# Patient Record
Sex: Female | Born: 1975 | Race: Black or African American | Hispanic: No | Marital: Single | Smoking: Heavy tobacco smoker
Health system: Southern US, Community
[De-identification: ages and names within clinical notes are randomized; demographics above are authoritative.]

## PROBLEM LIST (undated history)

## (undated) DIAGNOSIS — F319 Bipolar disorder, unspecified: Secondary | ICD-10-CM

---

## 1999-03-26 ENCOUNTER — Emergency Department (HOSPITAL_COMMUNITY): Admission: EM | Admit: 1999-03-26 | Discharge: 1999-03-26 | Payer: Self-pay | Admitting: Emergency Medicine

## 1999-03-27 ENCOUNTER — Encounter: Payer: Self-pay | Admitting: Emergency Medicine

## 1999-05-17 ENCOUNTER — Ambulatory Visit (HOSPITAL_COMMUNITY): Admission: RE | Admit: 1999-05-17 | Discharge: 1999-05-17 | Payer: Self-pay | Admitting: *Deleted

## 1999-10-14 ENCOUNTER — Inpatient Hospital Stay (HOSPITAL_COMMUNITY): Admission: AD | Admit: 1999-10-14 | Discharge: 1999-10-16 | Payer: Self-pay

## 1999-10-14 ENCOUNTER — Encounter (INDEPENDENT_AMBULATORY_CARE_PROVIDER_SITE_OTHER): Payer: Self-pay | Admitting: Specialist

## 2000-05-26 ENCOUNTER — Emergency Department (HOSPITAL_COMMUNITY): Admission: EM | Admit: 2000-05-26 | Discharge: 2000-05-26 | Payer: Self-pay | Admitting: Emergency Medicine

## 2001-01-31 ENCOUNTER — Emergency Department (HOSPITAL_COMMUNITY): Admission: EM | Admit: 2001-01-31 | Discharge: 2001-01-31 | Payer: Self-pay | Admitting: Emergency Medicine

## 2001-03-20 ENCOUNTER — Emergency Department (HOSPITAL_COMMUNITY): Admission: EM | Admit: 2001-03-20 | Discharge: 2001-03-20 | Payer: Self-pay | Admitting: *Deleted

## 2002-08-27 ENCOUNTER — Emergency Department (HOSPITAL_COMMUNITY): Admission: EM | Admit: 2002-08-27 | Discharge: 2002-08-27 | Payer: Self-pay | Admitting: Emergency Medicine

## 2003-01-27 ENCOUNTER — Emergency Department (HOSPITAL_COMMUNITY): Admission: EM | Admit: 2003-01-27 | Discharge: 2003-01-27 | Payer: Self-pay | Admitting: Emergency Medicine

## 2003-05-30 ENCOUNTER — Encounter: Admission: RE | Admit: 2003-05-30 | Discharge: 2003-05-30 | Payer: Self-pay | Admitting: Internal Medicine

## 2003-05-30 ENCOUNTER — Encounter: Payer: Self-pay | Admitting: Internal Medicine

## 2003-12-10 ENCOUNTER — Emergency Department (HOSPITAL_COMMUNITY): Admission: EM | Admit: 2003-12-10 | Discharge: 2003-12-10 | Payer: Self-pay | Admitting: Emergency Medicine

## 2003-12-12 ENCOUNTER — Emergency Department (HOSPITAL_COMMUNITY): Admission: EM | Admit: 2003-12-12 | Discharge: 2003-12-12 | Payer: Self-pay | Admitting: Family Medicine

## 2003-12-20 ENCOUNTER — Emergency Department (HOSPITAL_COMMUNITY): Admission: EM | Admit: 2003-12-20 | Discharge: 2003-12-20 | Payer: Self-pay | Admitting: Emergency Medicine

## 2004-02-03 ENCOUNTER — Emergency Department (HOSPITAL_COMMUNITY): Admission: EM | Admit: 2004-02-03 | Discharge: 2004-02-03 | Payer: Self-pay | Admitting: Emergency Medicine

## 2004-02-10 ENCOUNTER — Emergency Department (HOSPITAL_COMMUNITY): Admission: EM | Admit: 2004-02-10 | Discharge: 2004-02-10 | Payer: Self-pay | Admitting: Emergency Medicine

## 2004-03-25 ENCOUNTER — Encounter: Admission: RE | Admit: 2004-03-25 | Discharge: 2004-03-25 | Payer: Self-pay | Admitting: Obstetrics and Gynecology

## 2005-06-13 ENCOUNTER — Emergency Department (HOSPITAL_COMMUNITY): Admission: EM | Admit: 2005-06-13 | Discharge: 2005-06-13 | Payer: Self-pay | Admitting: *Deleted

## 2005-07-07 ENCOUNTER — Encounter: Admission: RE | Admit: 2005-07-07 | Discharge: 2005-07-07 | Payer: Self-pay | Admitting: Internal Medicine

## 2005-07-23 ENCOUNTER — Emergency Department (HOSPITAL_COMMUNITY): Admission: EM | Admit: 2005-07-23 | Discharge: 2005-07-23 | Payer: Self-pay | Admitting: Emergency Medicine

## 2005-08-26 ENCOUNTER — Inpatient Hospital Stay (HOSPITAL_COMMUNITY): Admission: AD | Admit: 2005-08-26 | Discharge: 2005-09-09 | Payer: Self-pay | Admitting: Psychiatry

## 2005-08-27 ENCOUNTER — Ambulatory Visit: Payer: Self-pay | Admitting: Psychiatry

## 2005-09-24 ENCOUNTER — Emergency Department (HOSPITAL_COMMUNITY): Admission: EM | Admit: 2005-09-24 | Discharge: 2005-09-24 | Payer: Self-pay | Admitting: Emergency Medicine

## 2005-10-11 ENCOUNTER — Emergency Department (HOSPITAL_COMMUNITY): Admission: EM | Admit: 2005-10-11 | Discharge: 2005-10-11 | Payer: Self-pay | Admitting: Emergency Medicine

## 2005-12-17 ENCOUNTER — Emergency Department (HOSPITAL_COMMUNITY): Admission: EM | Admit: 2005-12-17 | Discharge: 2005-12-17 | Payer: Self-pay | Admitting: Emergency Medicine

## 2006-12-24 ENCOUNTER — Emergency Department (HOSPITAL_COMMUNITY): Admission: EM | Admit: 2006-12-24 | Discharge: 2006-12-25 | Payer: Self-pay | Admitting: Emergency Medicine

## 2007-09-11 ENCOUNTER — Emergency Department (HOSPITAL_COMMUNITY): Admission: EM | Admit: 2007-09-11 | Discharge: 2007-09-11 | Payer: Self-pay | Admitting: Emergency Medicine

## 2009-06-30 ENCOUNTER — Emergency Department (HOSPITAL_BASED_OUTPATIENT_CLINIC_OR_DEPARTMENT_OTHER): Admission: EM | Admit: 2009-06-30 | Discharge: 2009-06-30 | Payer: Self-pay | Admitting: Emergency Medicine

## 2009-10-04 ENCOUNTER — Other Ambulatory Visit: Payer: Self-pay | Admitting: Emergency Medicine

## 2009-10-06 ENCOUNTER — Ambulatory Visit: Payer: Self-pay | Admitting: Psychiatry

## 2009-10-06 ENCOUNTER — Inpatient Hospital Stay (HOSPITAL_COMMUNITY): Admission: AD | Admit: 2009-10-06 | Discharge: 2009-11-21 | Payer: Self-pay | Admitting: Psychiatry

## 2009-12-01 ENCOUNTER — Emergency Department (HOSPITAL_COMMUNITY): Admission: EM | Admit: 2009-12-01 | Discharge: 2009-12-03 | Payer: Self-pay | Admitting: Emergency Medicine

## 2010-03-30 ENCOUNTER — Emergency Department (HOSPITAL_BASED_OUTPATIENT_CLINIC_OR_DEPARTMENT_OTHER): Admission: EM | Admit: 2010-03-30 | Discharge: 2010-03-30 | Payer: Self-pay | Admitting: Emergency Medicine

## 2010-04-05 ENCOUNTER — Other Ambulatory Visit: Payer: Self-pay | Admitting: Emergency Medicine

## 2010-04-05 ENCOUNTER — Inpatient Hospital Stay (HOSPITAL_COMMUNITY): Admission: EM | Admit: 2010-04-05 | Discharge: 2010-04-09 | Payer: Self-pay | Admitting: Internal Medicine

## 2010-04-06 ENCOUNTER — Ambulatory Visit: Payer: Self-pay | Admitting: Psychiatry

## 2010-10-15 ENCOUNTER — Emergency Department (HOSPITAL_BASED_OUTPATIENT_CLINIC_OR_DEPARTMENT_OTHER)
Admission: EM | Admit: 2010-10-15 | Discharge: 2010-10-15 | Disposition: A | Discharge status: Other Acute Inpatient Hospital | Payer: Self-pay | Source: Home / Self Care | Admitting: Emergency Medicine

## 2011-01-18 LAB — POCT TOXICOLOGY PANEL

## 2011-01-18 LAB — URINE MICROSCOPIC-ADD ON

## 2011-01-18 LAB — URINALYSIS, ROUTINE W REFLEX MICROSCOPIC
Glucose, UA: NEGATIVE mg/dL
Leukocytes, UA: NEGATIVE
Protein, ur: 100 mg/dL — AB
Specific Gravity, Urine: 1.031 — ABNORMAL HIGH (ref 1.005–1.030)
Urobilinogen, UA: 0.2 mg/dL (ref 0.0–1.0)
pH: 6 (ref 5.0–8.0)

## 2011-01-18 LAB — DIFFERENTIAL
Basophils Absolute: 0 10*3/uL (ref 0.0–0.1)
Basophils Relative: 1 % (ref 0–1)
Eosinophils Absolute: 0.1 10*3/uL (ref 0.0–0.7)
Lymphocytes Relative: 22 % (ref 12–46)
Lymphs Abs: 1.3 10*3/uL (ref 0.7–4.0)
Neutro Abs: 4 10*3/uL (ref 1.7–7.7)

## 2011-01-18 LAB — LIPASE, BLOOD: Lipase: 43 U/L (ref 23–300)

## 2011-01-18 LAB — CBC
MCHC: 34.4 g/dL (ref 30.0–36.0)
RDW: 11.5 % (ref 11.5–15.5)

## 2011-01-23 LAB — DIFFERENTIAL
Basophils Relative: 2 % — ABNORMAL HIGH (ref 0–1)
Lymphs Abs: 3 10*3/uL (ref 0.7–4.0)
Monocytes Relative: 10 % (ref 3–12)
Neutro Abs: 2.4 10*3/uL (ref 1.7–7.7)

## 2011-01-23 LAB — RAPID URINE DRUG SCREEN, HOSP PERFORMED
Amphetamines: NOT DETECTED
Benzodiazepines: NOT DETECTED
Cocaine: NOT DETECTED
Opiates: NOT DETECTED

## 2011-01-23 LAB — BASIC METABOLIC PANEL
BUN: 4 mg/dL — ABNORMAL LOW (ref 6–23)
CO2: 28 mEq/L (ref 19–32)
Chloride: 102 mEq/L (ref 96–112)
GFR calc non Af Amer: >60 mL/min (ref >60)
Sodium: 139 mEq/L (ref 135–145)

## 2011-01-23 LAB — URINALYSIS, ROUTINE W REFLEX MICROSCOPIC

## 2011-01-23 LAB — URINE CULTURE: Colony Count: 70000

## 2011-01-23 LAB — ETHANOL: Alcohol, Ethyl (B): 6 mg/dL (ref 0–10)

## 2011-01-23 LAB — CBC
HCT: 33.6 % — ABNORMAL LOW (ref 36.0–46.0)
Hemoglobin: 11.2 g/dL — ABNORMAL LOW (ref 12.0–15.0)
MCV: 92.7 fL (ref 78.0–100.0)
Platelets: 318 10*3/uL (ref 150–400)
RDW: 15.3 % (ref 11.5–15.5)
WBC: 6.5 10*3/uL (ref 4.0–10.5)

## 2011-01-23 LAB — URINE MICROSCOPIC-ADD ON

## 2011-01-24 LAB — URINALYSIS, ROUTINE W REFLEX MICROSCOPIC
Glucose, UA: NEGATIVE mg/dL
Glucose, UA: NEGATIVE mg/dL
Ketones, ur: NEGATIVE mg/dL
Ketones, ur: 40 mg/dL — AB
Nitrite: NEGATIVE
Nitrite: POSITIVE — AB
Protein, ur: NEGATIVE mg/dL
Protein, ur: 30 mg/dL — AB
Specific Gravity, Urine: 1.037 — ABNORMAL HIGH (ref 1.005–1.030)
Urobilinogen, UA: 1 mg/dL (ref 0.0–1.0)
Urobilinogen, UA: 1 mg/dL (ref 0.0–1.0)
Urobilinogen, UA: 1 mg/dL (ref 0.0–1.0)

## 2011-01-24 LAB — URINE CULTURE: Colony Count: >=100000

## 2011-01-24 LAB — RETICULOCYTES
RBC.: 2.72 MIL/uL — ABNORMAL LOW (ref 3.87–5.11)
Retic Count, Absolute: 16.3 10*3/uL — ABNORMAL LOW (ref 19.0–186.0)
Retic Ct Pct: 0.6 % (ref 0.4–3.1)

## 2011-01-24 LAB — BASIC METABOLIC PANEL
BUN: <1 mg/dL — ABNORMAL LOW (ref 6–23)
BUN: 7 mg/dL (ref 6–23)
CO2: 29 mEq/L (ref 19–32)
CO2: 29 mEq/L (ref 19–32)
CO2: 28 mEq/L (ref 19–32)
Calcium: 8.5 mg/dL (ref 8.4–10.5)
Calcium: 9.9 mg/dL (ref 8.4–10.5)
Chloride: 98 mEq/L (ref 96–112)
Creatinine, Ser: 0.65 mg/dL (ref 0.4–1.2)
Creatinine, Ser: 0.84 mg/dL (ref 0.4–1.2)
Creatinine, Ser: 0.7 mg/dL (ref 0.4–1.2)
GFR calc Af Amer: >60 mL/min (ref >60)
GFR calc Af Amer: >60 mL/min (ref >60)
GFR calc non Af Amer: >60 mL/min (ref >60)
Glucose, Bld: 82 mg/dL (ref 70–99)
Sodium: 137 mEq/L (ref 135–145)

## 2011-01-24 LAB — COMPREHENSIVE METABOLIC PANEL
ALT: 10 U/L (ref 0–35)
AST: 22 U/L (ref 0–37)
AST: 29 U/L (ref 0–37)
Albumin: 2.5 g/dL — ABNORMAL LOW (ref 3.5–5.2)
Alkaline Phosphatase: 26 U/L — ABNORMAL LOW (ref 39–117)
CO2: 28 mEq/L (ref 19–32)
Calcium: 6.8 mg/dL — ABNORMAL LOW (ref 8.4–10.5)
Calcium: 9.4 mg/dL (ref 8.4–10.5)
Creatinine, Ser: 1 mg/dL (ref 0.4–1.2)
GFR calc Af Amer: >60 mL/min (ref >60)
GFR calc Af Amer: >60 mL/min (ref >60)
GFR calc non Af Amer: >60 mL/min (ref >60)
Glucose, Bld: 99 mg/dL (ref 70–99)
Glucose, Bld: 124 mg/dL — ABNORMAL HIGH (ref 70–99)
Potassium: 3.4 mEq/L — ABNORMAL LOW (ref 3.5–5.1)
Sodium: 141 mEq/L (ref 135–145)
Total Protein: 4.7 g/dL — ABNORMAL LOW (ref 6.0–8.3)

## 2011-01-24 LAB — POCT TOXICOLOGY PANEL: Benzodiazepines: POSITIVE

## 2011-01-24 LAB — LIPID PANEL
HDL: 28 mg/dL — ABNORMAL LOW (ref >39)
Total CHOL/HDL Ratio: 3.7 RATIO
Triglycerides: 42 mg/dL (ref <150)

## 2011-01-24 LAB — DIFFERENTIAL
Basophils Absolute: 0.1 10*3/uL (ref 0.0–0.1)
Basophils Relative: 0 % (ref 0–1)
Basophils Relative: 2 % — ABNORMAL HIGH (ref 0–1)
Eosinophils Absolute: 0.4 10*3/uL (ref 0.0–0.7)
Eosinophils Absolute: 0.1 10*3/uL (ref 0.0–0.7)
Eosinophils Absolute: 0.3 10*3/uL (ref 0.0–0.7)
Eosinophils Relative: 7 % — ABNORMAL HIGH (ref 0–5)
Eosinophils Relative: 2 % (ref 0–5)
Lymphocytes Relative: 28 % (ref 12–46)
Lymphs Abs: 1.6 10*3/uL (ref 0.7–4.0)
Lymphs Abs: 1.8 10*3/uL (ref 0.7–4.0)
Monocytes Absolute: 0.7 10*3/uL (ref 0.1–1.0)
Monocytes Relative: 11 % (ref 3–12)
Neutro Abs: 1.6 10*3/uL — ABNORMAL LOW (ref 1.7–7.7)
Neutro Abs: 4 10*3/uL (ref 1.7–7.7)
Neutrophils Relative %: 31 % — ABNORMAL LOW (ref 43–77)
Neutrophils Relative %: 48 % (ref 43–77)
Neutrophils Relative %: 61 % (ref 43–77)

## 2011-01-24 LAB — CBC
Hemoglobin: 8.4 g/dL — ABNORMAL LOW (ref 12.0–15.0)
MCHC: 34.7 g/dL (ref 30.0–36.0)
MCHC: 34.5 g/dL (ref 30.0–36.0)
MCHC: 33.9 g/dL (ref 30.0–36.0)
MCHC: 34.3 g/dL (ref 30.0–36.0)
MCV: 92.3 fL (ref 78.0–100.0)
MCV: 90.2 fL (ref 78.0–100.0)
MCV: 91.4 fL (ref 78.0–100.0)
Platelets: 213 10*3/uL (ref 150–400)
Platelets: 319 10*3/uL (ref 150–400)
RBC: 3.26 MIL/uL — ABNORMAL LOW (ref 3.87–5.11)
RBC: 3.33 MIL/uL — ABNORMAL LOW (ref 3.87–5.11)
RBC: 2.62 MIL/uL — ABNORMAL LOW (ref 3.87–5.11)
RBC: 4.25 MIL/uL (ref 3.87–5.11)
RDW: 14.7 % (ref 11.5–15.5)
RDW: 14.7 % (ref 11.5–15.5)
RDW: 13.3 % (ref 11.5–15.5)
WBC: 5.1 10*3/uL (ref 4.0–10.5)
WBC: 5.9 10*3/uL (ref 4.0–10.5)

## 2011-01-24 LAB — CULTURE, BLOOD (ROUTINE X 2)

## 2011-01-24 LAB — CROSSMATCH
ABO/RH(D): A POS
Antibody Screen: NEGATIVE

## 2011-01-24 LAB — CARDIAC PANEL(CRET KIN+CKTOT+MB+TROPI)
CK, MB: 3.4 ng/mL (ref 0.3–4.0)
Relative Index: 2 (ref 0.0–2.5)
Relative Index: 2.3 (ref 0.0–2.5)
Total CK: 162 U/L (ref 7–177)
Total CK: 169 U/L (ref 7–177)
Troponin I: 0.01 ng/mL (ref 0.00–0.06)

## 2011-01-24 LAB — URINE MICROSCOPIC-ADD ON

## 2011-01-24 LAB — PREGNANCY, URINE
Preg Test, Ur: NEGATIVE
Preg Test, Ur: NEGATIVE

## 2011-01-24 LAB — MAGNESIUM
Magnesium: 1.5 mg/dL (ref 1.5–2.5)
Magnesium: 1.5 mg/dL (ref 1.5–2.5)

## 2011-01-24 LAB — FERRITIN: Ferritin: 65 ng/mL (ref 10–291)

## 2011-01-24 LAB — TSH: TSH: 1.518 u[IU]/mL (ref 0.350–4.500)

## 2011-01-24 LAB — PHOSPHORUS
Phosphorus: 1.6 mg/dL — ABNORMAL LOW (ref 2.3–4.6)
Phosphorus: 1.8 mg/dL — ABNORMAL LOW (ref 2.3–4.6)

## 2011-01-24 LAB — MRSA PCR SCREENING: MRSA by PCR: NEGATIVE

## 2011-01-24 LAB — LIPASE, BLOOD: Lipase: 52 U/L (ref 23–300)

## 2011-01-24 LAB — T4, FREE: Free T4: 1.32 ng/dL (ref 0.80–1.80)

## 2011-01-24 LAB — IRON AND TIBC
Saturation Ratios: 35 % (ref 20–55)
UIBC: 112 ug/dL

## 2011-02-08 LAB — URINALYSIS, ROUTINE W REFLEX MICROSCOPIC
Bilirubin Urine: NEGATIVE
Glucose, UA: NEGATIVE mg/dL
Hgb urine dipstick: NEGATIVE
Specific Gravity, Urine: 1.028 (ref 1.005–1.030)
Urobilinogen, UA: 0.2 mg/dL (ref 0.0–1.0)

## 2011-02-08 LAB — URINE MICROSCOPIC-ADD ON

## 2011-02-09 LAB — URINE CULTURE

## 2011-02-09 LAB — BASIC METABOLIC PANEL
BUN: 12 mg/dL (ref 6–23)
CO2: 27 mEq/L (ref 19–32)
Chloride: 102 mEq/L (ref 96–112)
Creatinine, Ser: 0.8 mg/dL (ref 0.4–1.2)
Glucose, Bld: 97 mg/dL (ref 70–99)
Potassium: 3.8 mEq/L (ref 3.5–5.1)

## 2011-02-09 LAB — URINALYSIS, ROUTINE W REFLEX MICROSCOPIC
Hgb urine dipstick: NEGATIVE
Nitrite: NEGATIVE
Specific Gravity, Urine: 1.019 (ref 1.005–1.030)
Urobilinogen, UA: 0.2 mg/dL (ref 0.0–1.0)

## 2011-02-09 LAB — RAPID URINE DRUG SCREEN, HOSP PERFORMED
Amphetamines: NOT DETECTED
Barbiturates: NOT DETECTED
Benzodiazepines: NOT DETECTED
Cocaine: NOT DETECTED
Opiates: NOT DETECTED

## 2011-02-09 LAB — DIFFERENTIAL
Basophils Relative: 1 % (ref 0–1)
Eosinophils Absolute: 0.3 10*3/uL (ref 0.0–0.7)
Eosinophils Relative: 3 % (ref 0–5)
Lymphs Abs: 1.9 10*3/uL (ref 0.7–4.0)
Monocytes Relative: 7 % (ref 3–12)
Neutrophils Relative %: 65 % (ref 43–77)

## 2011-02-09 LAB — CBC
HCT: 33.7 % — ABNORMAL LOW (ref 36.0–46.0)
MCHC: 33.4 g/dL (ref 30.0–36.0)
MCV: 93.1 fL (ref 78.0–100.0)
Platelets: 333 10*3/uL (ref 150–400)

## 2011-02-09 LAB — URINE MICROSCOPIC-ADD ON

## 2011-02-12 LAB — POTASSIUM: Potassium: 3.8 mEq/L (ref 3.5–5.1)

## 2011-02-12 LAB — URINALYSIS, ROUTINE W REFLEX MICROSCOPIC
Glucose, UA: NEGATIVE mg/dL
Ketones, ur: 40 mg/dL — AB
Leukocytes, UA: NEGATIVE
Specific Gravity, Urine: 1.031 — ABNORMAL HIGH (ref 1.005–1.030)
pH: 6.5 (ref 5.0–8.0)

## 2011-02-12 LAB — DIFFERENTIAL
Eosinophils Absolute: 0.2 10*3/uL (ref 0.0–0.7)
Eosinophils Relative: 3 % (ref 0–5)
Lymphocytes Relative: 51 % — ABNORMAL HIGH (ref 12–46)
Lymphs Abs: 3.7 10*3/uL (ref 0.7–4.0)
Monocytes Relative: 8 % (ref 3–12)

## 2011-02-12 LAB — BASIC METABOLIC PANEL
BUN: 4 mg/dL — ABNORMAL LOW (ref 6–23)
CO2: 30 mEq/L (ref 19–32)
Calcium: 9.6 mg/dL (ref 8.4–10.5)
Chloride: 101 mEq/L (ref 96–112)
Creatinine, Ser: 0.7 mg/dL (ref 0.4–1.2)
GFR calc Af Amer: >60 mL/min (ref >60)

## 2011-02-12 LAB — CBC
HCT: 34.5 % — ABNORMAL LOW (ref 36.0–46.0)
Hemoglobin: 12.1 g/dL (ref 12.0–15.0)
MCV: 93.4 fL (ref 78.0–100.0)
RBC: 3.7 MIL/uL — ABNORMAL LOW (ref 3.87–5.11)
WBC: 7.3 10*3/uL (ref 4.0–10.5)

## 2011-02-12 LAB — ETHANOL: Alcohol, Ethyl (B): <5 mg/dL (ref 0–10)

## 2011-02-12 LAB — POCT TOXICOLOGY PANEL

## 2011-02-12 LAB — URINE MICROSCOPIC-ADD ON

## 2011-03-06 ENCOUNTER — Emergency Department (HOSPITAL_COMMUNITY)
Admission: EM | Admit: 2011-03-06 | Discharge: 2011-03-06 | Disposition: A | Discharge status: Home / Self Care | Payer: Medicare Other | Attending: Emergency Medicine | Admitting: Emergency Medicine

## 2011-03-06 ENCOUNTER — Emergency Department (HOSPITAL_COMMUNITY): Payer: Medicare Other

## 2011-03-06 DIAGNOSIS — F209 Schizophrenia, unspecified: Secondary | ICD-10-CM | POA: Insufficient documentation

## 2011-03-06 DIAGNOSIS — E876 Hypokalemia: Secondary | ICD-10-CM | POA: Insufficient documentation

## 2011-03-06 DIAGNOSIS — F319 Bipolar disorder, unspecified: Secondary | ICD-10-CM | POA: Insufficient documentation

## 2011-03-06 DIAGNOSIS — E860 Dehydration: Secondary | ICD-10-CM | POA: Insufficient documentation

## 2011-03-06 DIAGNOSIS — R319 Hematuria, unspecified: Secondary | ICD-10-CM | POA: Insufficient documentation

## 2011-03-06 DIAGNOSIS — Z79899 Other long term (current) drug therapy: Secondary | ICD-10-CM | POA: Insufficient documentation

## 2011-03-06 LAB — RAPID URINE DRUG SCREEN, HOSP PERFORMED
Amphetamines: NOT DETECTED
Barbiturates: NOT DETECTED
Benzodiazepines: NOT DETECTED
Tetrahydrocannabinol: NOT DETECTED

## 2011-03-06 LAB — URINALYSIS, ROUTINE W REFLEX MICROSCOPIC
Nitrite: NEGATIVE
Protein, ur: 100 mg/dL — AB
Specific Gravity, Urine: 1.035 — ABNORMAL HIGH (ref 1.005–1.030)
Urobilinogen, UA: 1 mg/dL (ref 0.0–1.0)

## 2011-03-06 LAB — CBC
MCV: 93 fL (ref 78.0–100.0)
Platelets: 392 10*3/uL (ref 150–400)
RBC: 4.12 MIL/uL (ref 3.87–5.11)
RDW: 12.1 % (ref 11.5–15.5)
WBC: 8.6 10*3/uL (ref 4.0–10.5)

## 2011-03-06 LAB — COMPREHENSIVE METABOLIC PANEL
ALT: 10 U/L (ref 0–35)
AST: 19 U/L (ref 0–37)
Albumin: 4.4 g/dL (ref 3.5–5.2)
Alkaline Phosphatase: 41 U/L (ref 39–117)
Chloride: 101 mEq/L (ref 96–112)
GFR calc Af Amer: >60 mL/min (ref >60)
Potassium: 2.7 mEq/L — CL (ref 3.5–5.1)
Sodium: 139 mEq/L (ref 135–145)
Total Bilirubin: 1.4 mg/dL — ABNORMAL HIGH (ref 0.3–1.2)
Total Protein: 7.9 g/dL (ref 6.0–8.3)

## 2011-03-06 LAB — DIFFERENTIAL
Basophils Absolute: 0.1 10*3/uL (ref 0.0–0.1)
Eosinophils Absolute: 0.4 10*3/uL (ref 0.0–0.7)
Eosinophils Relative: 4 % (ref 0–5)
Lymphs Abs: 4 10*3/uL (ref 0.7–4.0)
Neutrophils Relative %: 40 % — ABNORMAL LOW (ref 43–77)

## 2011-03-06 LAB — URINE MICROSCOPIC-ADD ON

## 2011-03-06 LAB — POTASSIUM: Potassium: 3.7 mEq/L (ref 3.5–5.1)

## 2011-03-07 LAB — URINE CULTURE
Colony Count: 40000
Culture  Setup Time: 201204291810

## 2011-03-25 NOTE — H&P (Signed)
[name], [name] NO.:  [id number]   MEDICAL RECORD NO.:  [id number]          PATIENT TYPE:  IPS   LOCATION:  0402                          FACILITY:  [facility]   PHYSICIAN:  Sylvia Jungling, MD  DATE OF BIRTH:  [birth year]   DATE OF ADMISSION:  08/26/2005  DATE OF DISCHARGE:                         PSYCHIATRIC ADMISSION ASSESSMENT   This is an involuntary admission to the services of Dr. Geralyn Flash.   IDENTIFYING INFORMATION:  This is a 35 year old single African-American  female.  Apparently according to the commitment papers, the patient has a  diagnosis of major depression with psychosis.  She presented yesterday  psychotic.  She was paranoid.  She was delusional.  She was disorganized and  irrational in her thinking.  She thinks that her daughter, a 12-year-old, is  an imposter and that there is Clorox in the child's bath water.  She called  the police because she thinks they broke into her home, put feces in her  mouth and smeared her face and forced her to drink milk.  She is very  confused about who should take care of the 55-year-old daughter and is an  eminent danger to her with these delusions.  Hence, that is why she is being  admitted.  The patient apparently stopped her medications and missed her  appointments.  She was afraid of the nurse at the [facility] as well.   PAST PSYCHIATRIC HISTORY:  In 2002 she was admitted to [facility]  after her father died.  She has been an outpatient at [facility] since July 2002.   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  She states she  has never married, that she has a 13-year-old son.  She denies the 69-year-old  daughter.   FAMILY HISTORY:  She denies.   ALCOHOL AND DRUG HISTORY:  She denies.   MEDICAL CARE Leida Lut[place]:  She denies.  Apparently she is prescribed  minocycline presumably for acne.   MEDICATIONS:  According to the [facility] she is  prescribed  1.  Risperdal 3 mg at h.s.  2.  Remeron 15 mg at h.s.  It is unclear how long she has been noncompliant with medications.   DRUG ALLERGIES:  No known drug allergies.   PHYSICAL EXAMINATION:  She is refusing physical exam; however, her entry  vital signs showed she is 5 feet 5 inches, weight is 130, temperature 97.9,  blood pressure 109/74, pulse 99, respirations 20.  The remainder of her  physical exam appears to be within normal limits.  However, she is refusing  examination.  She also refused to have her labs drawn or to give [place] a urine.   MENTAL STATUS EXAM:  She is alert.  She is somewhat unkempt.  Her speech is  soft.  Her mood is worried.  Her affect is confused.  Her thought processes  are not clear.  Her judgment and insight are poor.  She is confused.  She  denies suicidal or homicidal ideation.  She has not been out of her room.  She appears paranoid.  It is unclear whether she is having other  hallucinations at this point in time.   ADMISSION DIAGNOSES:  AXIS I:  Major depressive disorder with psychotic  features.  Noncompliance with medications versus schizophrenia.  AXIS II:  Deferred.  AXIS III:  Acne.  AXIS IV:  None known.  AXIS V:  35.   PLAN:  Reestablish compliance with medications and to insure the safety of  her 10-year-old daughter.      Mickie Leonarda Salon, P.A.-C.      Sylvia Jungling, MD  Electronically Signed    MD/MEDQ  D:  08/27/2005  T:  08/27/2005  Job:  [id number]

## 2011-03-25 NOTE — Discharge Summary (Signed)
[name], [name] NO.:  [id number]   MEDICAL RECORD NO.:  [id number]          PATIENT TYPE:  IPS   LOCATION:  0402                          FACILITY:  [facility]   PHYSICIAN:  Jeanice Lim, M.D. DATE OF BIRTH:  1976/04/06   DATE OF ADMISSION:  08/26/2005  DATE OF DISCHARGE:  09/09/2005                                 DISCHARGE SUMMARY   IDENTIFYING DATA:  This is a patient admitted involuntarily to the services  of Dr. Electa Sniff, a 35 year old single African-American female.  According to  commitment papers, diagnosed with major depression and psychosis.  Presented  yesterday psychotic, paranoid, delusional, disorganized, irrational in  thinking.  Reported that her 73-year-old daughter was an imposter and that  there is Clorox in Surveyor, quantity.  She also called the police because  she thinks that someone broke into her home, put feces in her mouth and  smeared it on her face and forced her to drink milk.  The patient  apparently had stopped her medications, missed her appointments and was  afraid of the nurse, paranoid regarding the nurse at [facility]   PAST PSYCHIATRIC HISTORY:  Hospitalized in 2002.  Admitted at [facility] as her father died and been an outpatient at [facility] since July of 2002.  Graduate of 1994 from [employer].  Never married.  Unclear regarding children.  The patient's report was inconsistent.   ALCOHOL/DRUG HISTORY:  No alcohol and drug history known.   MEDICATIONS:  Risperdal, Remeron, unclear how long she had been noncompliant  with medications.   ALLERGIES:  No known drug allergies.   PHYSICAL EXAMINATION:  Physical and neurologic exam essentially within  normal limits.   LABORATORY DATA:  The patient refused labs initially.   MENTAL STATUS EXAM:  Alert, somewhat unkempt.  Speech soft, worried,  anxious, confused, unclear about what had happened, reporting it was a  dream  but that it was true and that she was sick because of what people had done  to her regarding persecutory delusions.  The patient denied suicidal or  homicidal ideation but isolated in her hospital room, not coming out to eat,  not taking care of hygiene, very paranoid, withdrawn, guarded.  Cognition  was grossly intact.  Judgment and insight quite impaired.   ADMISSION DIAGNOSES:  AXIS I:  Major depressive disorder with psychotic  features.  Rule out schizoaffective disorder, depressed-type.  AXIS II:  Deferred.  AXIS III:  Acne.  AXIS IV:  Moderate (limited support system).  AXIS V:  35/55-60.   HOSPITAL COURSE:  The patient was admitted and ordered routine p.r.n.  medications and underwent further monitoring.  Was encouraged to participate  in individual, group and milieu therapy.  The patient remained isolated,  guarded, paranoid, delusional.  Initially reports she had no family to  contact and gradually reported some people that could be contacted.  Initially agreed to take medication and then stop taking medication.  Remained paranoid, delusional, guarded and did not appear to be having good  p.o. intake.  Eventually, a second opinion was obtained after compliance was  episodic and patient appeared to be not improving and concerned about her  being able to care for herself.  Motivated a second opinion.  Dr. Dub Mikes saw  patient and agreed the patient needed medications forced due to the severity  of her delusions and the danger this represents to herself and that she may  not get better without treatment and that she is unable to care for herself.  She did not show agitation or threatening or dangerous behavior.  However,  just was paranoid, somewhat disorganized, delusional once medications were  started.  The patient was given a Prolixin Decanoate shot and Prolixin was  forced if patient refused p.o.  The patient would take p.o. and was  agreeable.  Became more cooperative  and appropriate on the unit.  Thought  processes became more organized. The patient had improved reality testing.  Was aware that she needed the medication and reported that she was willing  to go back to the place where initially she refused to go, which was her  apartment due to the delusion about the feces and now reporting that that  was just a dream and it did not happen and that she needs to stay on her  medication, showing improved insight.   CONDITION ON DISCHARGE:  The patient was discharged in improved condition  with no overt psychotic symptoms.  Mood was less depressed.  Affect  brighter.  Reality testing had improved.  The patient was given medication  education along with support system and aftercare planning in place.   DISCHARGE MEDICATIONS:  1.  Prolixin 5 mg at 8 p.m.  2.  Ambien 10 mg q.h.s. p.r.n.  3.  Prolixin Decanoate 25 mcg IM every three weeks.   FOLLOW UP:  The patient was to follow up with Dr. Lang Snow on Tuesday,  September 13, 2005 at 3 p.m. and with therapist, Cleda Clarks, on Thursday,  September 15, 2005 at 12 p.m.   DISCHARGE DIAGNOSES:  AXIS I:  Major depressive disorder with psychotic  features.  Rule out schizoaffective disorder, depressed-type.  AXIS II:  Deferred.  AXIS III:  Acne.  AXIS IV:  Moderate (limited support system).  AXIS V:  GAF on discharge 55.      Jeanice Lim, M.D.  Electronically Signed     JEM/MEDQ  D:  09/17/2005  T:  09/18/2005  Job:  [id number]

## 2011-04-02 ENCOUNTER — Emergency Department (HOSPITAL_COMMUNITY)
Admission: EM | Admit: 2011-04-02 | Discharge: 2011-04-04 | Disposition: A | Discharge status: Other Acute Inpatient Hospital | Payer: Medicare Other | Source: Home / Self Care | Attending: Emergency Medicine | Admitting: Emergency Medicine

## 2011-04-02 DIAGNOSIS — R000 Tachycardia, unspecified: Secondary | ICD-10-CM | POA: Insufficient documentation

## 2011-04-02 DIAGNOSIS — F319 Bipolar disorder, unspecified: Secondary | ICD-10-CM | POA: Insufficient documentation

## 2011-04-02 DIAGNOSIS — F202 Catatonic schizophrenia: Secondary | ICD-10-CM | POA: Insufficient documentation

## 2011-04-02 DIAGNOSIS — Z79899 Other long term (current) drug therapy: Secondary | ICD-10-CM | POA: Insufficient documentation

## 2011-04-02 LAB — URINALYSIS, ROUTINE W REFLEX MICROSCOPIC
Protein, ur: 30 mg/dL — AB
Specific Gravity, Urine: 1.03 (ref 1.005–1.030)
Urobilinogen, UA: 0.2 mg/dL (ref 0.0–1.0)

## 2011-04-02 LAB — URINE MICROSCOPIC-ADD ON

## 2011-04-02 LAB — COMPREHENSIVE METABOLIC PANEL
Albumin: 4.4 g/dL (ref 3.5–5.2)
BUN: 10 mg/dL (ref 6–23)
Calcium: 9.5 mg/dL (ref 8.4–10.5)
Creatinine, Ser: 0.73 mg/dL (ref 0.4–1.2)
Total Protein: 7.5 g/dL (ref 6.0–8.3)

## 2011-04-02 LAB — DIFFERENTIAL
Basophils Relative: 1 % (ref 0–1)
Eosinophils Absolute: 0.2 10*3/uL (ref 0.0–0.7)
Eosinophils Relative: 3 % (ref 0–5)
Lymphs Abs: 3.2 10*3/uL (ref 0.7–4.0)
Monocytes Relative: 13 % — ABNORMAL HIGH (ref 3–12)

## 2011-04-02 LAB — CBC
MCH: 31.3 pg (ref 26.0–34.0)
MCV: 92.6 fL (ref 78.0–100.0)
Platelets: 415 10*3/uL — ABNORMAL HIGH (ref 150–400)
RDW: 12 % (ref 11.5–15.5)

## 2011-04-02 LAB — RAPID URINE DRUG SCREEN, HOSP PERFORMED
Amphetamines: NOT DETECTED
Barbiturates: NOT DETECTED
Benzodiazepines: NOT DETECTED
Cocaine: NOT DETECTED
Opiates: NOT DETECTED

## 2011-04-04 ENCOUNTER — Inpatient Hospital Stay (HOSPITAL_COMMUNITY)
Admission: EM | Admit: 2011-04-04 | Discharge: 2011-04-19 | DRG: 885 | Disposition: A | Discharge status: Home / Self Care | Payer: Medicare Other | Source: Other Acute Inpatient Hospital | Attending: Psychiatry | Admitting: Psychiatry

## 2011-04-04 DIAGNOSIS — F70 Mild intellectual disabilities: Secondary | ICD-10-CM

## 2011-04-04 DIAGNOSIS — Z91199 Patient's noncompliance with other medical treatment and regimen due to unspecified reason: Secondary | ICD-10-CM

## 2011-04-04 DIAGNOSIS — F200 Paranoid schizophrenia: Principal | ICD-10-CM

## 2011-04-04 DIAGNOSIS — R634 Abnormal weight loss: Secondary | ICD-10-CM

## 2011-04-04 DIAGNOSIS — Z9119 Patient's noncompliance with other medical treatment and regimen: Secondary | ICD-10-CM

## 2011-04-04 LAB — URINE CULTURE
Colony Count: NO GROWTH
Culture  Setup Time: 201205271125
Culture: NO GROWTH

## 2011-04-08 DIAGNOSIS — F200 Paranoid schizophrenia: Secondary | ICD-10-CM

## 2011-04-08 LAB — PREGNANCY, URINE: Preg Test, Ur: NEGATIVE

## 2011-05-13 NOTE — H&P (Signed)
NAME:  [name], [name]               ACCOUNT NO.:  [id number]  MEDICAL RECORD NO.:  [id number]           PATIENT TYPE:  I  LOCATION:  0403                          FACILITY:  [facility]  PHYSICIAN:  Vic Ripper, P.A.-C.DATE OF BIRTH:  [birth year]  DATE OF ADMISSION:  04/04/2011 DATE OF DISCHARGE:                      PSYCHIATRIC ADMISSION ASSESSMENT   HISTORY OF PRESENT ILLNESS:  This is an involuntary admission to the services of Dr. Rogers Blocker. This is a 35 year old single [place] American female. The patient was put on involuntary commitment as she had, had an increase in her psychotic symptoms.  On interview, she was significantly paranoid and withdrawn, in her current state she was felt to be a danger to herself and hence she was admitted.  The patient was actually recently at [facility].  It does not say when she was admitted but she was discharged on the 21st.  She was admitted for a schizoaffective disorder, depressed-state and apparently she had been noncompliant with her medication and she also had weight loss.  The patient apparently has a longstanding history for schizophrenia.  She has been known to have a mental health diagnosis since at least 2000 when she was an inpatient at [facility].  A friend took her to the emergency room on Saturday as she felt that the patient had not been taking her meds and she knew that she had not had her scripts filled.  The patient was drowsy, not talking.  She has a history for paranoid delusions, numerous inpatient hospitalizations.  She is not known to be a substance abuser. The patient would not talk to the assessor and hence was admitted. Today, initially when interviewing the patient, she was bright and cheerful, but as soon as I started asking questions she became guarded, fearful, almost looked like a wounded animal.  FAMILY HISTORY:  According to the records that we have there is no family history.  ALCOHOL AND  DRUG ABUSE HISTORY:  There is no indication of alcohol or drug abuse.  PRIMARY CARE PROVIDER:  Her primary care provider is [facility].  MEDICAL PROBLEMS:  Medical problems none are known although she has had recent weight loss, likely from a failure to just eat and drink.  MEDICATIONS:  At the time of discharge from [facility] on 03/28/11 she was to take Zyprexa 15 mg at bedtime, Trileptal 600 mg at bedtime, Celexa 40 mg p.o. at bedtime, multivitamin daily, Artane 5 mg p.o. b.i.d., and she is due for her next dose of Risperdal Consta 37.5 mg on 04/06/11.  DRUG ALLERGIES:  NO KNOWN DRUG ALLERGIES.  POSITIVE PHYSICAL FINDINGS:  She appears older than her stated age. Part of this is due to her affect.  Her vital signs were stable.  She was afebrile.  Her temperature ranged from 97.5 to 98.1.  Her respirations were 16 to 22. Pulse was 97 to 138.  Blood pressure was 108/71 to 122/82.  Her CBC had no untoward findings.  She had no alcohol.  She had a small amount of leukocyte esterase in her urine, but her nitrite was negative and her potassium was slightly low at  3.1. After presentation to the emergency room unfortunately there were no 400 hall beds available on our unit, [facility] was contacted; however, they were unable to take her back.  She was maintained in the emergency room until a bed became available here in the Behavior Health Unit late today.  MENTAL STATUS EXAM:  She was alert.  Unfortunately, she will not answer questions.  I am unable to determine how oriented she is.  Apparently, she can speak, but she is choosing not to at the moment.  She appears anxious and depressed. She has no known active suicidal or homicidal tendencies.  DIAGNOSIS:  AXIS I:  Schizophrenia paranoid-type. AXIS II:  Deferred but find out if she has mild MR. AXIS III:  None known although there was connotation from [facility] that she has had recent weight loss. AXIS IV:  Noncompliance  with medications, chronic mental illness. AXIS V:  40.  PLAN:  To admit for safety and stabilization.  We will get her compliant with her meds and we will have our case managers investigate what the discharge plan is and whether it needs to be changed.  Estimated length of stay is 3-5 days.     Vic Ripper, P.A.-C.     MD/MEDQ  D:  04/04/2011  T:  04/04/2011  Job:  [id number]  Electronically Signed by Jaci Lazier ADAMS P.A.-C. on 04/17/2011 09:49:41 AM Electronically Signed by Eulogio Ditch  on 05/13/2011 07:28:18 AM

## 2011-05-19 NOTE — Discharge Summary (Signed)
[name], COULSTON NO.:  [id number]  MEDICAL RECORD NO.:  [id number]  LOCATION:  0403                          FACILITY:  [facility]  PHYSICIAN:  Eulogio Ditch, MD DATE OF BIRTH:  03/23/1976  DATE OF ADMISSION:  04/04/2011 DATE OF DISCHARGE:  04/19/2011                              DISCHARGE SUMMARY   IDENTIFYING INFORMATION:  This is a single female, 35 year old African American.  This is an involuntary admission.  HISTORY OF PRESENT ILLNESS:  Allyson presented by way of our emergency room where she was taken by a friend.  She had not been taking medications and there were concerns about her safety.  She has a history of schizoaffective disorder depressed type and had recently been experiencing weight loss and not functioning properly at home.  She was exhibiting significant paranoia and was emotionally and socially withdrawn and there was concern about her ability to adequately care for herself.  MEDICAL EVALUATION AND DIAGNOSTIC STUDIES:  She appears older than her stated age.  Admitting vital signs:  Temperature 97.5, pulse 97, respirations 16, blood pressure 122/82.  Diagnostic studies revealed a normal CBC.  Alcohol screen negative.  Potassium slightly decreased at 3.1.  She appeared unkempt and guarded but was oriented to basic person and situation.  COURSE OF HOSPITALIZATION:  She was admitted to our acute stabilization unit and given a working diagnosis of schizophrenia, paranoid type.  We learned that she had recently been discharged from [facility] on 03/28/2011 and at that time was taking Trileptal 600 mg at bedtime, Celexa 40 mg also at bedtime, Artane 5 mg b.i.d. and Zyprexa 15 mg at bedtime.  She had also been taking Risperdal Consta 37.5 mg, which was due on 04/06/2011.  We elected to discontinue the Zyprexa and Trileptal and placed her on 2 mg of Risperdal p.o. q.h.s.  We continued her Celexa 40 mg.  She continued to be quite  paranoid and withdrawn, initially, we continued to work with the medications and gradually titrated the Risperdal to 4 mg p.o. q.h.s. and added Klonopin 0.5 mg p.o. t.i.d. to help with anxiety.  Through the first part of her stay, she was very disorganized in thought, very guarded, appeared internally preoccupied and unable to think logically.  We did place her on the waiting list for [facility].  June 12, she was still having problems with being very guarded, disorganized in thought, unable to carry a conversation through with logical sentences but was able to make some of her needs known.  Hygiene and grooming still remained very poor.  She expressed that she did not want to take the Klonopin and we discontinued that but did continue the Celexa and the Risperdal.  On occasional days, she would refuse medications and this would occur intermittently with various doses.  She took them better when family encouraged her to take them and on June 6, she did agree to take Risperdal Consta 25 mg.  She displayed slapping movements of her hands at times, for which we had initially given her some Benadryl for thinking this might be related to EPS but her symptoms were not alleviated with Benadryl and it became clear  that this was more of a behavioral issue, possibly related to anxiety.  This is one of the reasons we had started her on the Klonopin that she preferred not to be on and we ultimately discontinued it.  The behavior would become worse when we were speaking directly to her or she was attempting to verbally respond to [place] and organize her thoughts.  Weight loss at home and not eating properly was a concern and she required a lot of encouragement and structure to her day to eat properly here in the hospital.  The nurses worked with her intensively on this and we ordered a nutrition consult while here to guide [place] in food selection  Meanwhile, our case manager worked with  the family, specifically her payee and the father of her 62 year old child.  He stated that he and the patient's mother were frightened for her to be discharged since she was not always in agreement with taking her medications.  They were going to work on filing for legal guardianship for her.  She continued to periodically refuse medications and we insisted that she go to the cafeteria and not refuse meals.  Ultimately, she requested to go home and we set limits that she would be required to bathe daily, eat all meals in the cafeteria regularly and not refuse her medications.  She did not voice any suicidal or homicidal thoughts but would choose to remain in bed for long periods of time and ignore self-care, if she was allowed to and this was her family's concern.  We had considered the possibility of mild mental retardation but we had no specific evidence that this was the case.  Ultimately, she was eating and bathing regularly with motivation to go home and she was discharged home on June 12 for follow-up by [facility] ACT team.  DISCHARGE/PLAN:  Follow up with [facility] on June 12 at 10:00 a.m. who are going to pick her up and take her home.  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 Axis II:  No diagnosis. Axis III: No diagnosis. Axis IV: Deferred. Axis V: Current 55, past year not known.  DISCHARGE MEDICATIONS: 1. Klonopin 0.5 mg q.h.s. 2. Eucerin cream apply to dry skin twice daily as needed. 3. Risperdal 4 mg p.o. q.h.s. 4. Risperdal Consta 37.5 mg q.14 days, last given June 12. 5. Celexa 40 mg daily. 6. Multivitamin 1 daily. 7. She was instructed to discontinue Trileptal, Zyprexa and Artane.  DISCHARGE CONDITION:  Stable.     Margaret A. Lorin Picket, N.P.   ______________________________ Eulogio Ditch, MD    MAS/MEDQ  D:  05/19/2011  T:  05/19/2011  Job:   [id number]  Electronically Signed by Kari Baars N.P. on 05/19/2011 10:06:57 AM Electronically Signed by Eulogio Ditch  on 05/19/2011 03:32:02 PM

## 2011-08-16 LAB — DIFFERENTIAL
Basophils Absolute: 0.1
Basophils Relative: 1
Lymphocytes Relative: 37
Neutro Abs: 2.5
Neutrophils Relative %: 46

## 2011-08-16 LAB — CBC
HCT: 33.1 — ABNORMAL LOW
Hemoglobin: 11.4 — ABNORMAL LOW
MCV: 93.4
RDW: 12.7

## 2011-08-16 LAB — COMPREHENSIVE METABOLIC PANEL
Alkaline Phosphatase: 37 — ABNORMAL LOW
BUN: 4 — ABNORMAL LOW
Chloride: 103
Creatinine, Ser: 0.81
Glucose, Bld: 96
Potassium: 3.5
Total Bilirubin: 0.7
Total Protein: 6.7

## 2011-08-16 LAB — RAPID URINE DRUG SCREEN, HOSP PERFORMED
Barbiturates: NOT DETECTED
Benzodiazepines: POSITIVE — AB
Cocaine: NOT DETECTED

## 2011-08-16 LAB — URINALYSIS, ROUTINE W REFLEX MICROSCOPIC
Bilirubin Urine: NEGATIVE
Hgb urine dipstick: NEGATIVE
Ketones, ur: NEGATIVE
Nitrite: NEGATIVE
Protein, ur: NEGATIVE
Urobilinogen, UA: 1

## 2011-08-16 LAB — POCT PREGNANCY, URINE
Operator id: 285491
Preg Test, Ur: NEGATIVE

## 2011-08-16 LAB — ETHANOL: Alcohol, Ethyl (B): <5

## 2011-08-16 LAB — URINE MICROSCOPIC-ADD ON

## 2012-05-22 IMAGING — CR DG CHEST 2V
3 series · 3 of 3 positions shown · non-contrast
Comparison: Portable chest x-ray 04/06/2010 and two-view chest x-
ray 09/11/2007.

CHEST - 2 VIEW 03/06/2011:

[w chest pa]
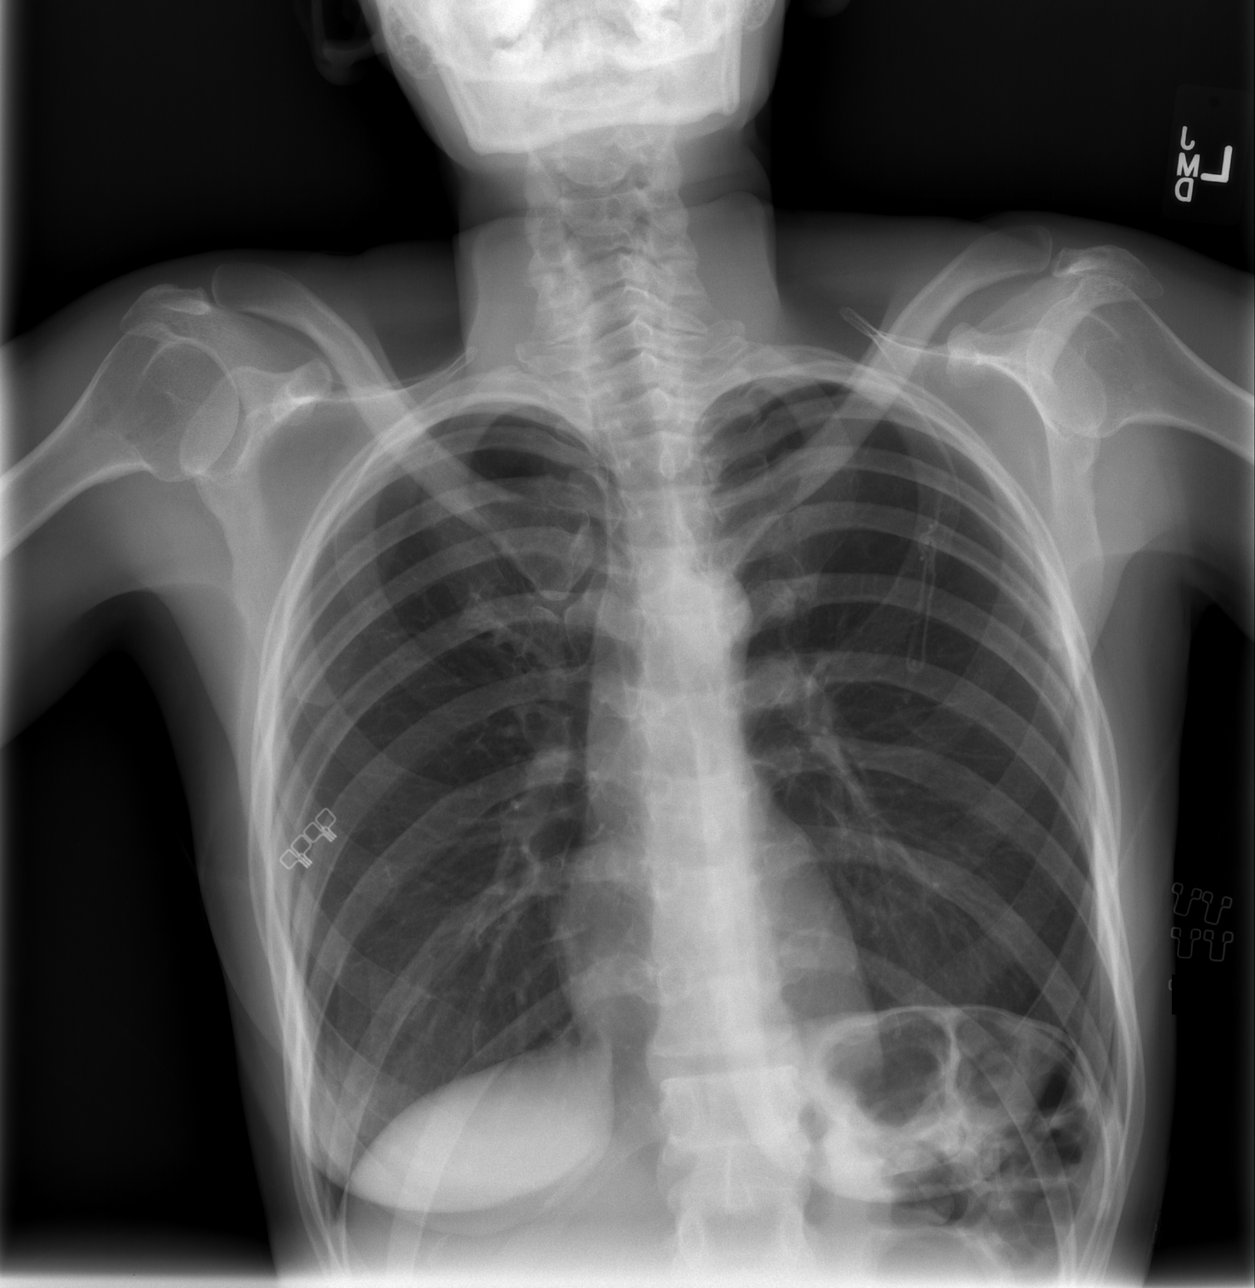

[w chest lat (1 of 2)]
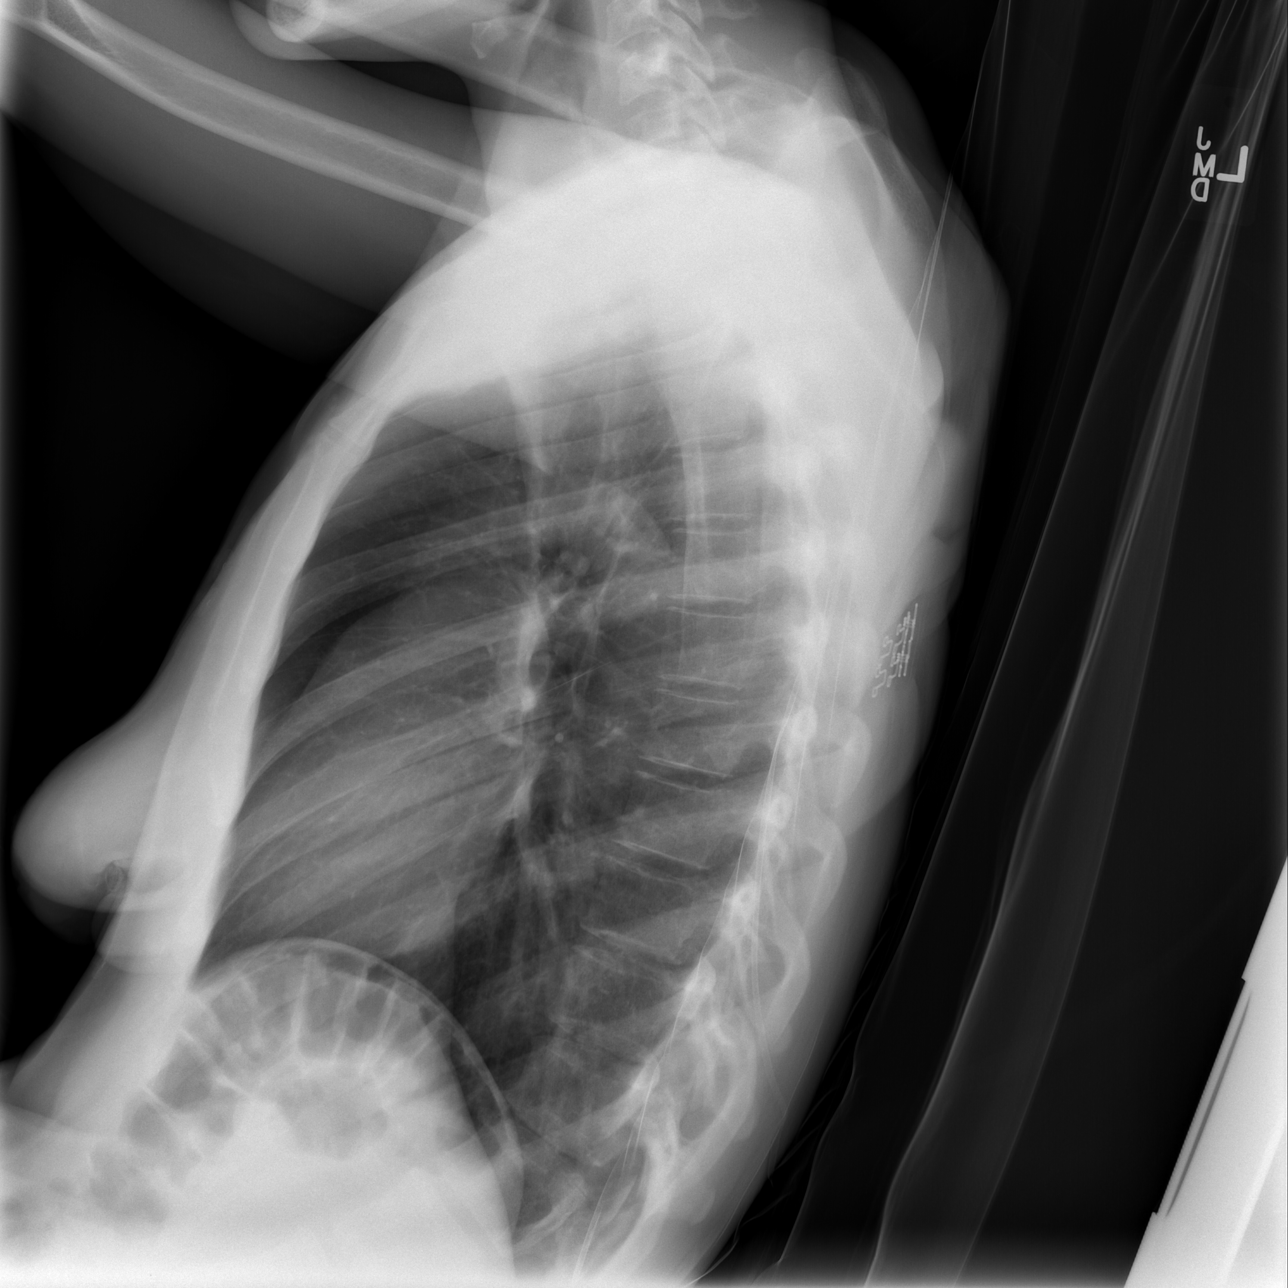

[w chest lat (2 of 2)]
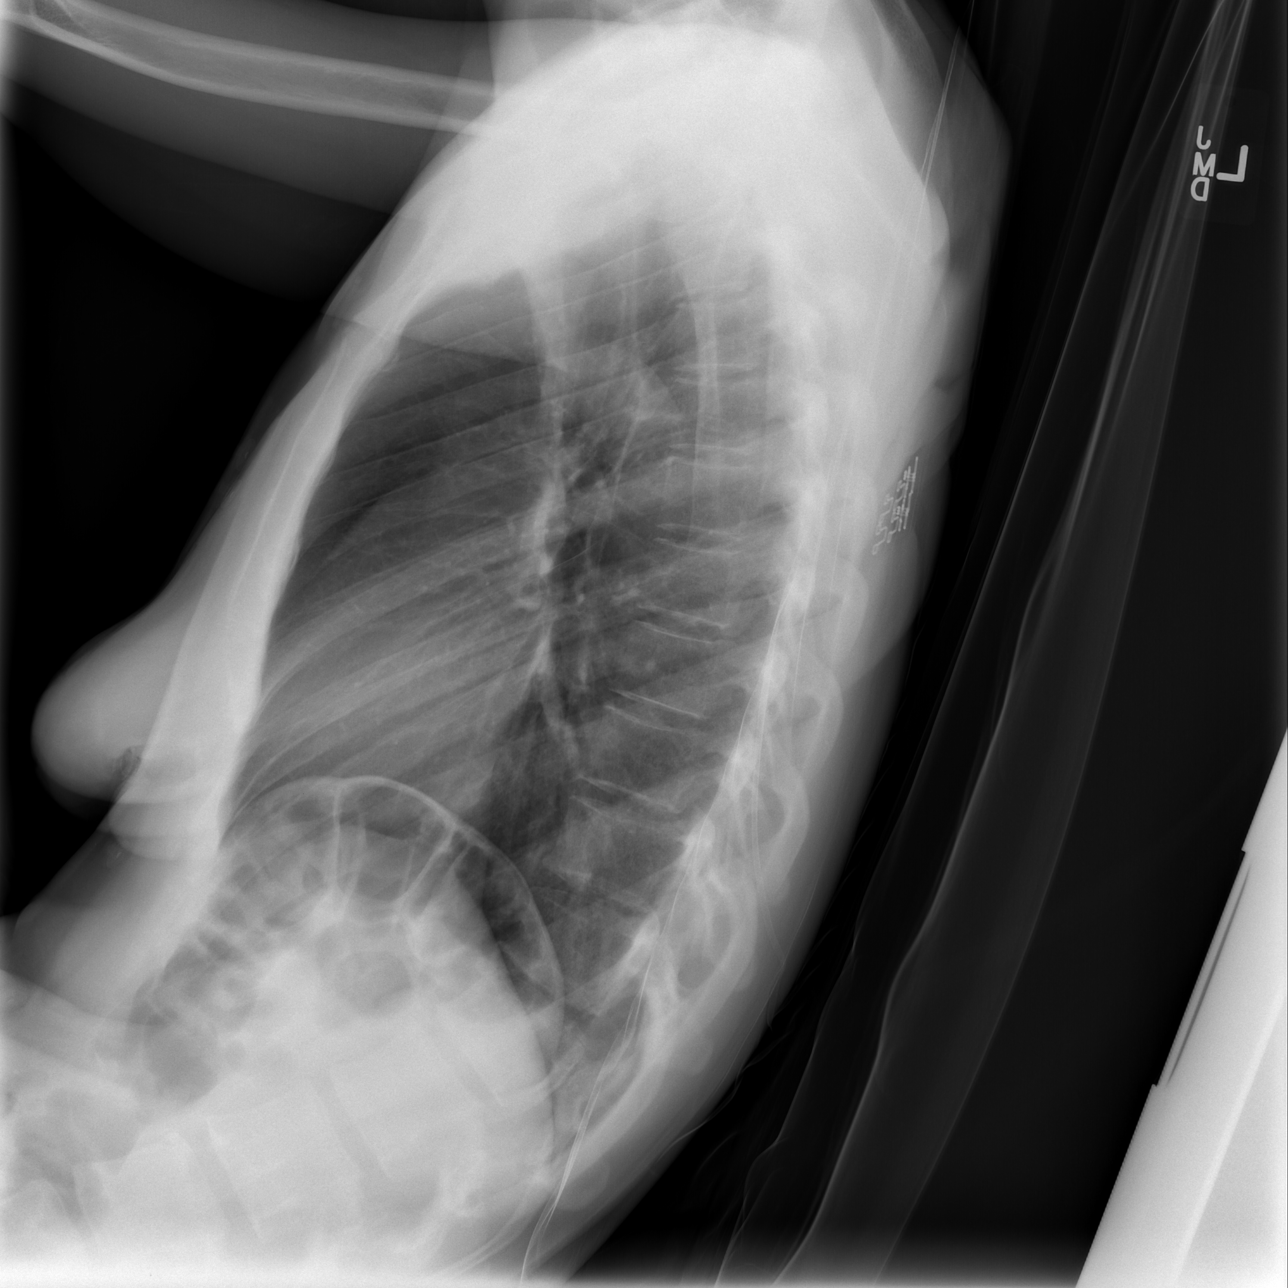

[3 of 3 positions shown; findings below may reference images not displayed]

FINDINGS: Cardiomediastinal silhouette unremarkable and unchanged.
Lungs clear.  Bronchovascular markings normal.  No pleural
effusions.  Visualized bony thorax intact.  Significant weight loss
since the prior examination.
IMPRESSION: No acute cardiopulmonary disease.

## 2014-02-15 ENCOUNTER — Emergency Department (HOSPITAL_COMMUNITY)
Admission: EM | Admit: 2014-02-15 | Discharge: 2014-02-15 | Disposition: A | Discharge status: Home / Self Care | Payer: Medicare Other | Attending: Emergency Medicine | Admitting: Emergency Medicine

## 2014-02-15 ENCOUNTER — Emergency Department (HOSPITAL_COMMUNITY): Payer: Medicare Other

## 2014-02-15 ENCOUNTER — Encounter (HOSPITAL_COMMUNITY): Payer: Self-pay | Admitting: Emergency Medicine

## 2014-02-15 DIAGNOSIS — Z79899 Other long term (current) drug therapy: Secondary | ICD-10-CM | POA: Insufficient documentation

## 2014-02-15 DIAGNOSIS — F200 Paranoid schizophrenia: Secondary | ICD-10-CM | POA: Insufficient documentation

## 2014-02-15 DIAGNOSIS — F17200 Nicotine dependence, unspecified, uncomplicated: Secondary | ICD-10-CM | POA: Insufficient documentation

## 2014-02-15 DIAGNOSIS — F319 Bipolar disorder, unspecified: Secondary | ICD-10-CM | POA: Insufficient documentation

## 2014-02-15 DIAGNOSIS — J069 Acute upper respiratory infection, unspecified: Secondary | ICD-10-CM | POA: Insufficient documentation

## 2014-02-15 HISTORY — DX: Bipolar disorder, unspecified: F31.9

## 2014-02-15 LAB — CBC WITH DIFFERENTIAL/PLATELET
BASOS ABS: 0 10*3/uL (ref 0.0–0.1)
BASOS PCT: 1 % (ref 0–1)
EOS ABS: 0.2 10*3/uL (ref 0.0–0.7)
EOS PCT: 3 % (ref 0–5)
HEMATOCRIT: 37.2 % (ref 36.0–46.0)
Hemoglobin: 12.6 g/dL (ref 12.0–15.0)
Lymphocytes Relative: 35 % (ref 12–46)
Lymphs Abs: 2.3 10*3/uL (ref 0.7–4.0)
MCH: 31.9 pg (ref 26.0–34.0)
MCHC: 33.9 g/dL (ref 30.0–36.0)
MCV: 94.2 fL (ref 78.0–100.0)
MONO ABS: 0.8 10*3/uL (ref 0.1–1.0)
Monocytes Relative: 12 % (ref 3–12)
Neutro Abs: 3.4 10*3/uL (ref 1.7–7.7)
Neutrophils Relative %: 49 % (ref 43–77)
PLATELETS: 318 10*3/uL (ref 150–400)
RBC: 3.95 MIL/uL (ref 3.87–5.11)
RDW: 12.9 % (ref 11.5–15.5)
WBC: 6.8 10*3/uL (ref 4.0–10.5)

## 2014-02-15 LAB — BASIC METABOLIC PANEL
BUN: 6 mg/dL (ref 6–23)
CALCIUM: 9.5 mg/dL (ref 8.4–10.5)
CO2: 26 mEq/L (ref 19–32)
CREATININE: 0.75 mg/dL (ref 0.50–1.10)
Chloride: 102 mEq/L (ref 96–112)
GFR calc Af Amer: >90 mL/min (ref >90)
Glucose, Bld: 120 mg/dL — ABNORMAL HIGH (ref 70–99)
Potassium: 4.2 mEq/L (ref 3.7–5.3)
SODIUM: 144 meq/L (ref 137–147)

## 2014-02-15 LAB — TROPONIN I

## 2014-02-15 LAB — D-DIMER, QUANTITATIVE (NOT AT ARMC)

## 2014-02-15 LAB — RAPID STREP SCREEN (MED CTR MEBANE ONLY): STREPTOCOCCUS, GROUP A SCREEN (DIRECT): NEGATIVE

## 2014-02-15 MED ORDER — ALBUTEROL SULFATE (2.5 MG/3ML) 0.083% IN NEBU
5.0000 mg | INHALATION_SOLUTION | Freq: Once | RESPIRATORY_TRACT | Status: AC
  Administered 2014-02-15: 5 mg via RESPIRATORY_TRACT
  Filled 2014-02-15: qty 6

## 2014-02-15 MED ORDER — IPRATROPIUM BROMIDE 0.02 % IN SOLN
0.5000 mg | Freq: Once | RESPIRATORY_TRACT | Status: AC
  Administered 2014-02-15: 0.5 mg via RESPIRATORY_TRACT
  Filled 2014-02-15: qty 2.5

## 2014-02-15 NOTE — ED Notes (Signed)
Pt aware that her mother is sending someone to pick her up. Pt verbalized she will wait in waiting room until they arrive. Pt given discharge instructions and verbalizes her understanding.

## 2014-02-15 NOTE — Discharge Instructions (Signed)
Your tests today are normal. You have a upper respiratory tract infection (cold). You can drink plenty of fluids. Take cough and sore throat lozenges as needed. Recheck if you get a high fever or seem worse.

## 2014-02-15 NOTE — ED Notes (Signed)
Dr.Knapp at bedside  

## 2014-02-15 NOTE — ED Notes (Addendum)
While ambulating Patient HR started as 202, 02 started as 96 RA. Patient arm was trembling, Returning to Patient room HR 132, RA 99.

## 2014-02-15 NOTE — ED Notes (Signed)
Pt presents from home with c/o of shortness of breath that started last night.  Pt has a history of bipolar disorder, has not had medications for over 1 month.  Pt is 97% on room air.  Pt has a visible tremor to the left arm, ongoing per pt for a while.

## 2014-02-15 NOTE — ED Notes (Signed)
Called Bennetta LaosShirley Hajduk, pt's mother and notified her that pt will be discharged. Pt's mother will arrange for someone to pick pt up in waiting room.

## 2014-02-15 NOTE — ED Provider Notes (Signed)
CSN: [id number]     Arrival date & time 02/15/14  0757 History   First MD Initiated Contact with Patient 02/15/14 [id number]     Chief Complaint  Patient presents with  . Shortness of Breath     (Consider location/radiation/quality/duration/timing/severity/associated sxs/prior Treatment) HPI Patient denies prior history of any type of breathing problems. She states yesterday evening she started having a mild sore throat, cough with clear mucus production, and feeling short of breath. She denies rhinorrhea, fever, or chills. She states she had to sit up to breathe last night. She denies any swelling or pain in her legs. She does report some lower central chest pain, although she states it is "kind of" there, she is unable to characterize it any more than that. She states she mainly has trouble breathing. Patient has no obvious respiratory distress while sitting watching her during her interview. She also states she vomited once yesterday and had diarrhea x3.  Patient has history of paranoid schizophrenia. She states she has voices that talk to her. She said they are not always nice however she cannot tell me what they say that is not nice. She denies been telling her to hurt herself or hurt other people.  PCP Dr Concepcion [facility]  Past Medical History  Diagnosis Date  . Bipolar 1 disorder   Per last [facility] admission Schizophrenia with paranoid type   History reviewed. No pertinent past surgical history. History reviewed. No pertinent family history. History  Substance Use Topics  . Smoking status: Heavy Tobacco Smoker  . Smokeless tobacco: Never Used  . Alcohol Use: Yes     Comment: occasional   On disability for learning disability Smokes 5 cigs a day No alcohol "lately"  OB History   Grav Para Term Preterm Abortions TAB SAB Ect Mult Living                 Review of Systems  All other systems reviewed and are negative.     Allergies  Shrimp  Home Medications    Current Outpatient Rx  Name  Route  Sig  Dispense  Refill  . benztropine (COGENTIN) 0.5 MG tablet   Oral   Take 0.5 mg by mouth 2 (two) times daily.         . risperiDONE (RISPERDAL) 3 MG tablet   Oral   Take 3 mg by mouth at bedtime.         Marland Kitchen RisperiDONE Microspheres (RISPERDAL CONSTA IM)   Intramuscular   Inject 1 application into the muscle every 14 (fourteen) days.          BP 104/65  Pulse 102  Temp(Src) 97.4 F (36.3 C) (Oral)  Resp 18  Ht 5\' 6"  (1.676 m)  Wt 150 lb (68.04 kg)  BMI 24.22 kg/m2  SpO2 99%  Vital signs normal except tachycardia  Physical Exam  Nursing note and vitals reviewed. Constitutional: She is oriented to person, place, and time. She appears well-developed and well-nourished.  Non-toxic appearance. She does not appear ill. No distress.  HENT:  Head: Normocephalic and atraumatic.  Right Ear: External ear normal.  Left Ear: External ear normal.  Nose: Nose normal. No mucosal edema or rhinorrhea.  Mouth/Throat: Oropharynx is clear and moist and mucous membranes are normal. No dental abscesses or uvula swelling.  Eyes: Conjunctivae and EOM are normal. Pupils are equal, round, and reactive to light.  Neck: Normal range of motion and full passive range of motion without pain. Neck  supple.  Cardiovascular: Normal rate, regular rhythm and normal heart sounds.  Exam reveals no gallop and no friction rub.   No murmur heard. Pulmonary/Chest: Effort normal. No respiratory distress. She has decreased breath sounds. She has no wheezes. She has no rhonchi. She has no rales. She exhibits no tenderness and no crepitus.  Abdominal: Soft. Normal appearance and bowel sounds are normal. She exhibits no distension. There is no tenderness. There is no rebound and no guarding.  Musculoskeletal: Normal range of motion. She exhibits no edema and no tenderness.  Moves all extremities well.   Neurological: She is alert and oriented to person, place, and time. She  has normal strength. No cranial nerve deficit.  Patient has some jerking or twitching of her upper extremities at times during her interview.  Skin: Skin is warm, dry and intact. No rash noted. No erythema. No pallor.  Psychiatric: Her mood appears not anxious. Her affect is blunt. Her speech is delayed. She is slowed.  Very slow flat affect. Patient speaks so softly she is hard to understand.    ED Course  Procedures (including critical care time)   Medications  albuterol (PROVENTIL) (2.5 MG/3ML) 0.083% nebulizer solution 5 mg (5 mg Nebulization Given 02/15/14 0849)  ipratropium (ATROVENT) nebulizer solution 0.5 mg (0.5 mg Nebulization Given 02/15/14 0850)   Review of her last behavior health admission shows she was admitted with schizoaffective disorder of depressed type, however her discharge diagnosis was schizophrenia with paranoid type. They also question whether she had some mental retardation. They also describe she has some hand flapping that was felt to be behavioral and not yet from her medications.   Recheck at 10:00 patient states no change in her breathing after the nebulizer. Her lungs again sound diminished with no wheezing or rhonchi. She again does not appear to have any respiratory distress. She was asked if she was felt like she had swelling in her throat and then she said no. Unemployed to have staff ambulate her and check her pulse ox. All of her tests appeared to be normal so far.  Patient ambulated by nursing staff. Her initial heart rate was 200 however this was felt to be due to her arms jerking. Her O2 sat was 96% and improved to 99% at the end of her walk. Her heart rate was reported to be as high as 132 but when they put her on the monitor it was in the 90s. This corresponded to her EKG on arrival.  Patient had a lot of testing done because she has underlying psychiatric illness and possibly some mental retardation in her history and affect were hard to interpret.  However all of her talus are normal without concerns for any worrisome underlying condition such as PE, MI, pneumonia. She also has no evidence for airway infection such as PTA or retropharyngeal abscess.  Labs Review  Imaging Review Dg Chest 2 View  02/15/2014   CLINICAL DATA:  Shortness of breath overnight  EXAM: CHEST  2 VIEW  COMPARISON:  DG CHEST 2 VIEW dated 03/06/2011  FINDINGS: The lungs are adequately inflated and clear. The cardiopericardial silhouette is normal in size. The pulmonary vascularity is not engorged. The mediastinum is normal in width. There is no pleural effusion or pneumothorax or pneumomediastinum. The trachea is midline. The observed portions of the bony thorax exhibit no acute abnormalities.  IMPRESSION: There is no evidence of pneumonia nor other active cardiopulmonary disease.   Electronically Signed   By: David  [place]  On: 02/15/2014 09:39   Dg Neck Soft Tissue  02/15/2014   SHORTNESS CLINICAL DATA: Breath and through discomfort  EXAM: NECK SOFT TISSUES - 1+ VIEW  COMPARISON:  None.  FINDINGS: The cervical airway appears widely patent. There is no ballooning of the hypopharynx. The epiglottis is sharp. The prevertebral soft tissue spaces appear normal. The tonsils and adenoids are grossly normal. Cervical vertebral bodies are preserved in height where visualized.  IMPRESSION: The cervical airway appears normal and there are no findings to suggest a peritonsillar abscess.   Electronically Signed   By: David  [place]   On: 02/15/2014 11:30         EKG Interpretation  Date/Time:    Ventricular Rate:    PR Interval:    QRS Duration:   QT Interval:    QTC Calculation:   R Axis:     Text Interpretation:         Date: 02/15/2014  Rate: 97  Rhythm: normal sinus rhythm  QRS Axis: normal  Intervals: normal  ST/T Wave abnormalities: nonspecific ST changes  Conduction Disutrbances:none  Narrative Interpretation:   Old EKG Reviewed: none available     MDM    Final diagnoses:  URI (upper respiratory infection)    Plan discharge  Devoria Albe, MD, Armando Gang     Ward Givens, MD 02/15/14 1222

## 2014-02-15 NOTE — ED Notes (Signed)
Albuterol treatment ended for pt.

## 2014-02-17 LAB — CULTURE, GROUP A STREP

## 2015-05-04 IMAGING — CR DG CHEST 2V
1 series · 1 of 1 positions shown · non-contrast
Comparison: DG CHEST 2 VIEW dated 03/06/2011

CLINICAL DATA: Shortness of breath overnight

EXAM:
CHEST  2 VIEW

[view not recorded]
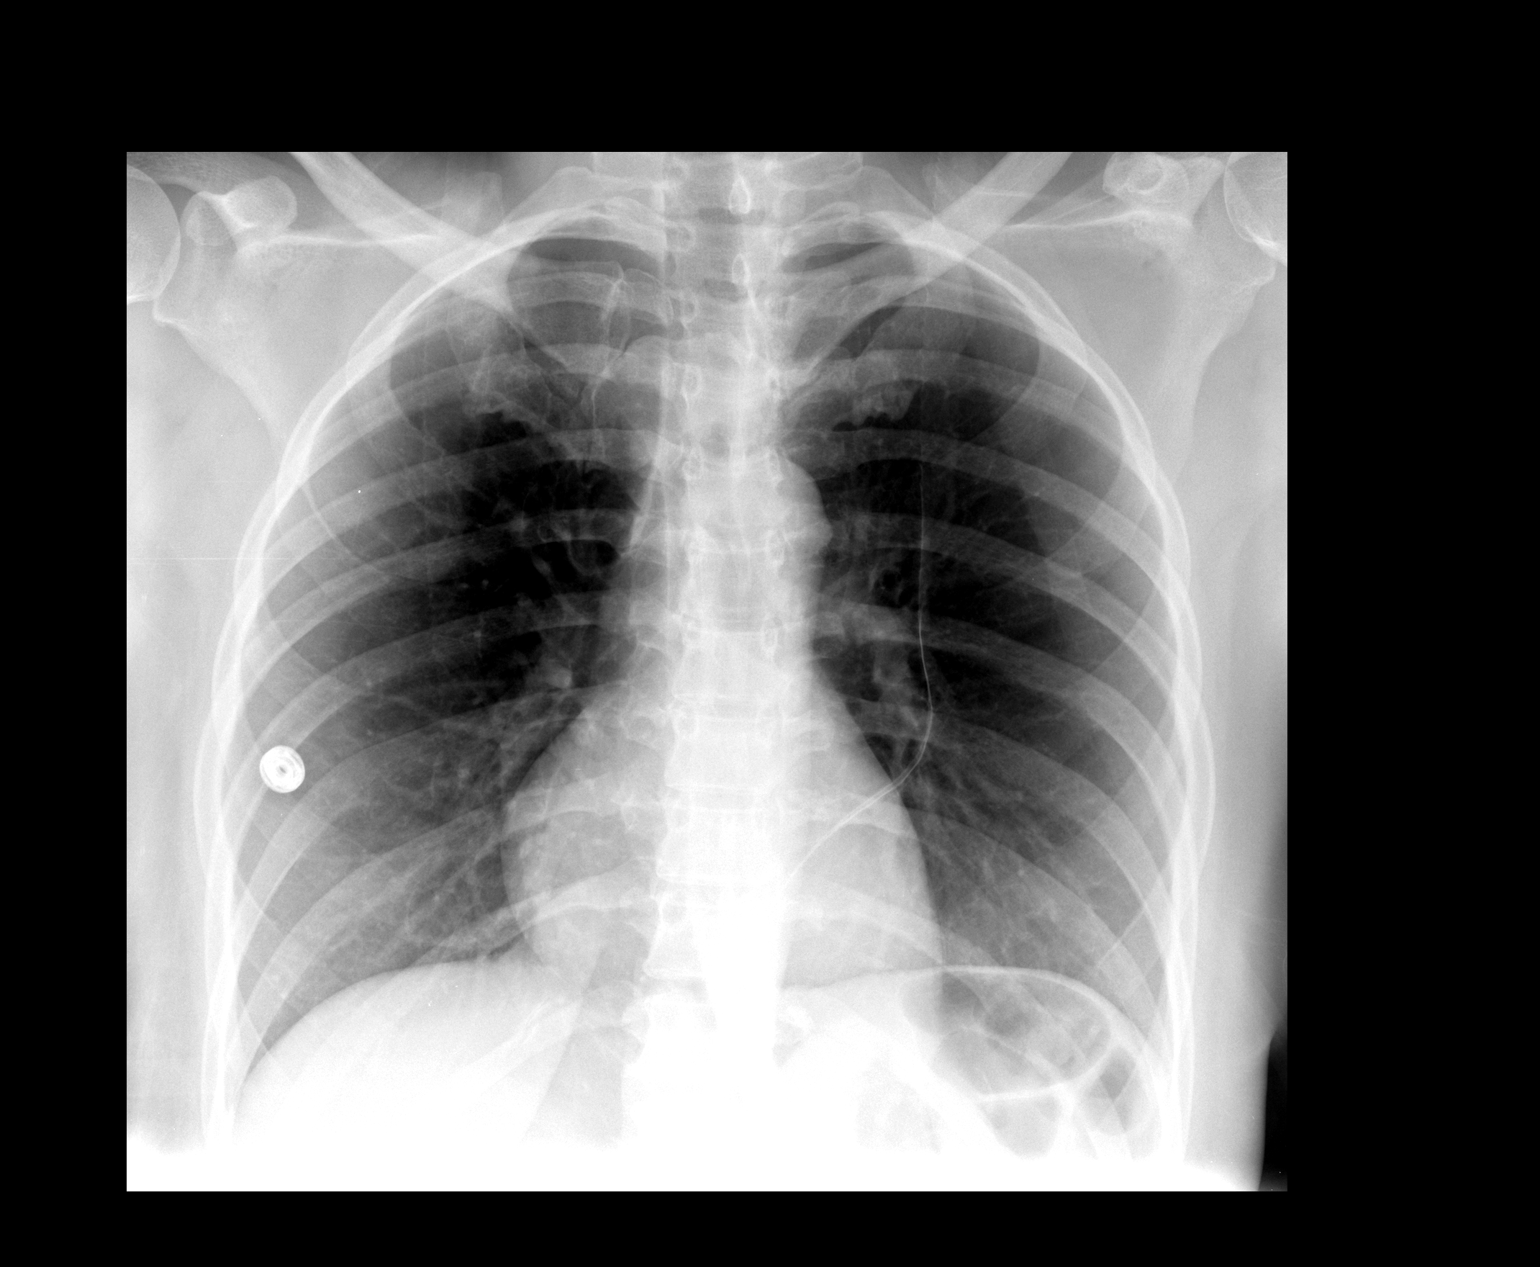

[1 of 1 positions shown; findings below may reference images not displayed]

FINDINGS: The lungs are adequately inflated and clear. The cardiopericardial
silhouette is normal in size. The pulmonary vascularity is not
engorged. The mediastinum is normal in width. There is no pleural
effusion or pneumothorax or pneumomediastinum. The trachea is
midline. The observed portions of the bony thorax exhibit no acute
abnormalities.
IMPRESSION: There is no evidence of pneumonia nor other active cardiopulmonary
disease.
